# Patient Record
Sex: Female | Born: 1975 | Race: Black or African American | Hispanic: No | Marital: Single
Health system: Southern US, Community
[De-identification: ages and names within clinical notes are randomized; demographics above are authoritative.]

---

## 2010-01-12 ENCOUNTER — Encounter: Payer: Self-pay | Admitting: Internal Medicine

## 2010-01-29 ENCOUNTER — Encounter: Payer: Self-pay | Admitting: Internal Medicine

## 2012-04-14 ENCOUNTER — Emergency Department: Payer: Self-pay | Admitting: Emergency Medicine

## 2017-01-08 ENCOUNTER — Encounter: Payer: Self-pay | Admitting: Emergency Medicine

## 2017-01-08 ENCOUNTER — Emergency Department
Admission: EM | Admit: 2017-01-08 | Discharge: 2017-01-08 | Disposition: A | Discharge status: Home / Self Care | Payer: 59 | Attending: Emergency Medicine | Admitting: Emergency Medicine

## 2017-01-08 ENCOUNTER — Emergency Department: Payer: 59

## 2017-01-08 DIAGNOSIS — I10 Essential (primary) hypertension: Secondary | ICD-10-CM | POA: Insufficient documentation

## 2017-01-08 DIAGNOSIS — R079 Chest pain, unspecified: Secondary | ICD-10-CM | POA: Diagnosis not present

## 2017-01-08 DIAGNOSIS — F12122 Cannabis abuse with intoxication with perceptual disturbance: Secondary | ICD-10-CM | POA: Insufficient documentation

## 2017-01-08 DIAGNOSIS — F12922 Cannabis use, unspecified with intoxication with perceptual disturbance: Secondary | ICD-10-CM

## 2017-01-08 DIAGNOSIS — R000 Tachycardia, unspecified: Secondary | ICD-10-CM | POA: Diagnosis not present

## 2017-01-08 LAB — URINE DRUG SCREEN, QUALITATIVE (ARMC ONLY)
AMPHETAMINES, UR SCREEN: NOT DETECTED
BENZODIAZEPINE, UR SCRN: NOT DETECTED
Barbiturates, Ur Screen: NOT DETECTED
CANNABINOID 50 NG, UR ~~LOC~~: POSITIVE — AB
Cocaine Metabolite,Ur ~~LOC~~: NOT DETECTED
MDMA (Ecstasy)Ur Screen: NOT DETECTED
Methadone Scn, Ur: NOT DETECTED
Opiate, Ur Screen: NOT DETECTED
PHENCYCLIDINE (PCP) UR S: NOT DETECTED
TRICYCLIC, UR SCREEN: NOT DETECTED

## 2017-01-08 LAB — BASIC METABOLIC PANEL
ANION GAP: 7 (ref 5–15)
BUN: 10 mg/dL (ref 6–20)
CHLORIDE: 108 mmol/L (ref 101–111)
CO2: 24 mmol/L (ref 22–32)
Calcium: 9.3 mg/dL (ref 8.9–10.3)
Creatinine, Ser: 0.74 mg/dL (ref 0.44–1.00)
Glucose, Bld: 151 mg/dL — ABNORMAL HIGH (ref 65–99)
POTASSIUM: 3.3 mmol/L — AB (ref 3.5–5.1)
SODIUM: 139 mmol/L (ref 135–145)

## 2017-01-08 LAB — CBC
HEMATOCRIT: 37.4 % (ref 35.0–47.0)
Hemoglobin: 12.6 g/dL (ref 12.0–16.0)
MCH: 28.1 pg (ref 26.0–34.0)
MCHC: 33.8 g/dL (ref 32.0–36.0)
MCV: 83.2 fL (ref 80.0–100.0)
Platelets: 280 10*3/uL (ref 150–440)
RBC: 4.49 MIL/uL (ref 3.80–5.20)
RDW: 13.6 % (ref 11.5–14.5)
WBC: 8.3 10*3/uL (ref 3.6–11.0)

## 2017-01-08 LAB — TROPONIN I

## 2017-01-08 MED ORDER — SODIUM CHLORIDE 0.9 % IV BOLUS (SEPSIS)
1000.0000 mL | Freq: Once | INTRAVENOUS | Status: AC
  Administered 2017-01-08: 1000 mL via INTRAVENOUS

## 2017-01-08 MED ORDER — LORAZEPAM 2 MG/ML IJ SOLN
1.0000 mg | Freq: Once | INTRAMUSCULAR | Status: AC
  Administered 2017-01-08: 1 mg via INTRAVENOUS
  Filled 2017-01-08: qty 1

## 2017-01-08 NOTE — ED Provider Notes (Signed)
[facility] Emergency Department Provider Note   First MD Initiated Contact with Patient 01/08/17 0448     (approximate)  I have reviewed the triage vital signs and the nursing notes.   HISTORY  Chief Complaint Tachycardia    HPI Melody Hill is a 41 y.o. female presents to the emergency department with acute onset of chest tightness and tingling sensation over the entire body rapid heartbeat and throat discomfort after having alcoholic beverages and eating a "brownie". Patient states that she believes it may have been something in the brownie".   Past medical history Hypertension There are no active problems to display for this patient.   Past surgical history None  Prior to Admission medications   Medication Sig Start Date End Date Taking? Authorizing Provider  verapamil (CALAN-SR) 240 MG CR tablet Take 1 tablet by mouth daily.   Yes [provider]    Allergies No known drug allergies No family history on file.  Social History Social History  Substance Use Topics  . Smoking status: Not on file  . Smokeless tobacco: Not on file  . Alcohol use Not on file    Review of Systems Constitutional: No fever/chills Eyes: No visual changes. ENT: No sore throat. Cardiovascular: Denies chest pain. Respiratory: Denies shortness of breath. Gastrointestinal: No abdominal pain.  No nausea, no vomiting.  No diarrhea.  No constipation. Genitourinary: Negative for dysuria. Musculoskeletal: Negative for neck pain.  Negative for back pain. Integumentary: Negative for rash. Neurological: Negative for headaches, focal weakness or numbness. Psychiatric:Positive for anxiety, positive EtOH condition   ____________________________________________   PHYSICAL EXAM:  VITAL SIGNS: ED Triage Vitals  Enc Vitals Group     BP 01/08/17 0429 (!) 160/108     Pulse Rate 01/08/17 0429 (!) 128     Resp 01/08/17 0429 16     Temp 01/08/17 0429  98.5 F (36.9 C)     Temp src --      SpO2 01/08/17 0429 100 %     Weight 01/08/17 0430 65.3 kg (144 lb)     Height 01/08/17 0430 1.626 m (5\' 4" )     Head Circumference --      Peak Flow --      Pain Score 01/08/17 0429 4     Pain Loc --      Pain Edu? --      Excl. in GC? --     Constitutional: Alert and oriented. Well appearing and in no acute distress.Appears anxious Eyes: Conjunctivae are normal. Head: Atraumatic. Mouth/Throat: Mucous membranes are moist.  Oropharynx non-erythematous. Neck: No stridor.   Cardiovascular: Tachycardia, regular rhythm. Good peripheral circulation. Grossly normal heart sounds. Respiratory: Normal respiratory effort.  No retractions. Lungs CTAB. Gastrointestinal: Soft and nontender. No distention.  Musculoskeletal: No lower extremity tenderness nor edema. No gross deformities of extremities. Neurologic:  Normal speech and language. No gross focal neurologic deficits are appreciated.  Skin:  Skin is warm, dry and intact. No rash noted. Psychiatric: Anxious affect. Speech and behavior are normal.  ____________________________________________   LABS (all labs ordered are listed, but only abnormal results are displayed)  Labs Reviewed  BASIC METABOLIC PANEL - Abnormal; Notable for the following:       Result Value   Potassium 3.3 (*)    Glucose, Bld 151 (*)    All other components within normal limits  URINE DRUG SCREEN, QUALITATIVE ([facility] ONLY) - Abnormal; Notable for the following:    Cannabinoid 50 Ng,  Ur [place] POSITIVE (*)    All other components within normal limits  CBC  TROPONIN I   ____________________________________________  EKG  ED ECG REPORT I, [place] N Lavere Shinsky, the attending physician, personally viewed and interpreted this ECG.   Date: 01/08/2017  EKG Time: 4:28 AM  Rate: 132  Rhythm: Sinus tachycardia  Axis: Normal  Intervals: Normal  ST&T Change: None  ____________________________________________  RADIOLOGY I,  [place] Marlin Brys, personally viewed and evaluated these images (plain radiographs) as part of my medical decision making, as well as reviewing the written report by the radiologist.  Dg Chest Port 1 View  Result Date: 01/08/2017 CLINICAL DATA:  Acute onset of body tingling and tachycardia. Throat discomfort. Initial encounter. EXAM: PORTABLE CHEST 1 VIEW COMPARISON:  None. FINDINGS: The lungs are well-aerated and clear. There is no evidence of focal opacification, pleural effusion or pneumothorax. The cardiomediastinal silhouette is within normal limits. No acute osseous abnormalities are seen. IMPRESSION: No acute cardiopulmonary process seen. Electronically Signed   By: Roanna RaiderJeffery  Chang M.D.   On: 01/08/2017 05:14      Procedures   ____________________________________________   INITIAL IMPRESSION / ASSESSMENT AND PLAN / ED COURSE  Pertinent labs & imaging results that were available during my care of the patient were reviewed by me and considered in my medical decision making (see chart for details).  41 year old female presenting to the emergency department with above stated symptoms. Suspect etiology patient's symptoms secondary to marijuana. Patient received 1 mg IV Ativan 2 L IV normal saline with resolution of symptoms.      ____________________________________________  FINAL CLINICAL IMPRESSION(S) / ED DIAGNOSES  Final diagnoses:  Sinus tachycardia  Cannabis intoxication with perceptual disturbance (HCC)     MEDICATIONS GIVEN DURING THIS VISIT:  Medications  LORazepam (ATIVAN) injection 1 mg (1 mg Intravenous Given 01/08/17 0456)  sodium chloride 0.9 % bolus 1,000 mL (0 mLs Intravenous Stopped 01/08/17 0612)  sodium chloride 0.9 % bolus 1,000 mL (1,000 mLs Intravenous New Bag/Given 01/08/17 [id number])     NEW OUTPATIENT MEDICATIONS STARTED DURING THIS VISIT:  New Prescriptions   No medications on file    Modified Medications   No medications on file    Discontinued  Medications   No medications on file     Note:  This document was prepared using Dragon voice recognition software and may include unintentional dictation errors.    Darci CurrentBrown, [place] N, MD 01/08/17 [phone]

## 2017-01-08 NOTE — ED Notes (Signed)
Pt verbalized understanding of discharge instructions. NAD at this time. 

## 2017-01-08 NOTE — ED Triage Notes (Signed)
Pt states "i went out for girl's night and had a couple of drinks and some brownies. I think there were drugs in the brownies because not I feel weird and tingly." pt complains of body tingling, tachycardia and "discomfort in my throat". Pt ambulatory without difficulty, resps unlabored.

## 2017-01-08 NOTE — ED Notes (Signed)
Dr. Manson PasseyBrown in treatment room. Pt placed on cont ekg monitoring.

## 2018-10-23 IMAGING — DX DG CHEST 1V PORT
1 series · 1 of 1 positions shown · non-contrast
Comparison: None.

CLINICAL DATA: Acute onset of body tingling and tachycardia. Throat
discomfort. Initial encounter.

EXAM:
PORTABLE CHEST 1 VIEW

[chest ap]
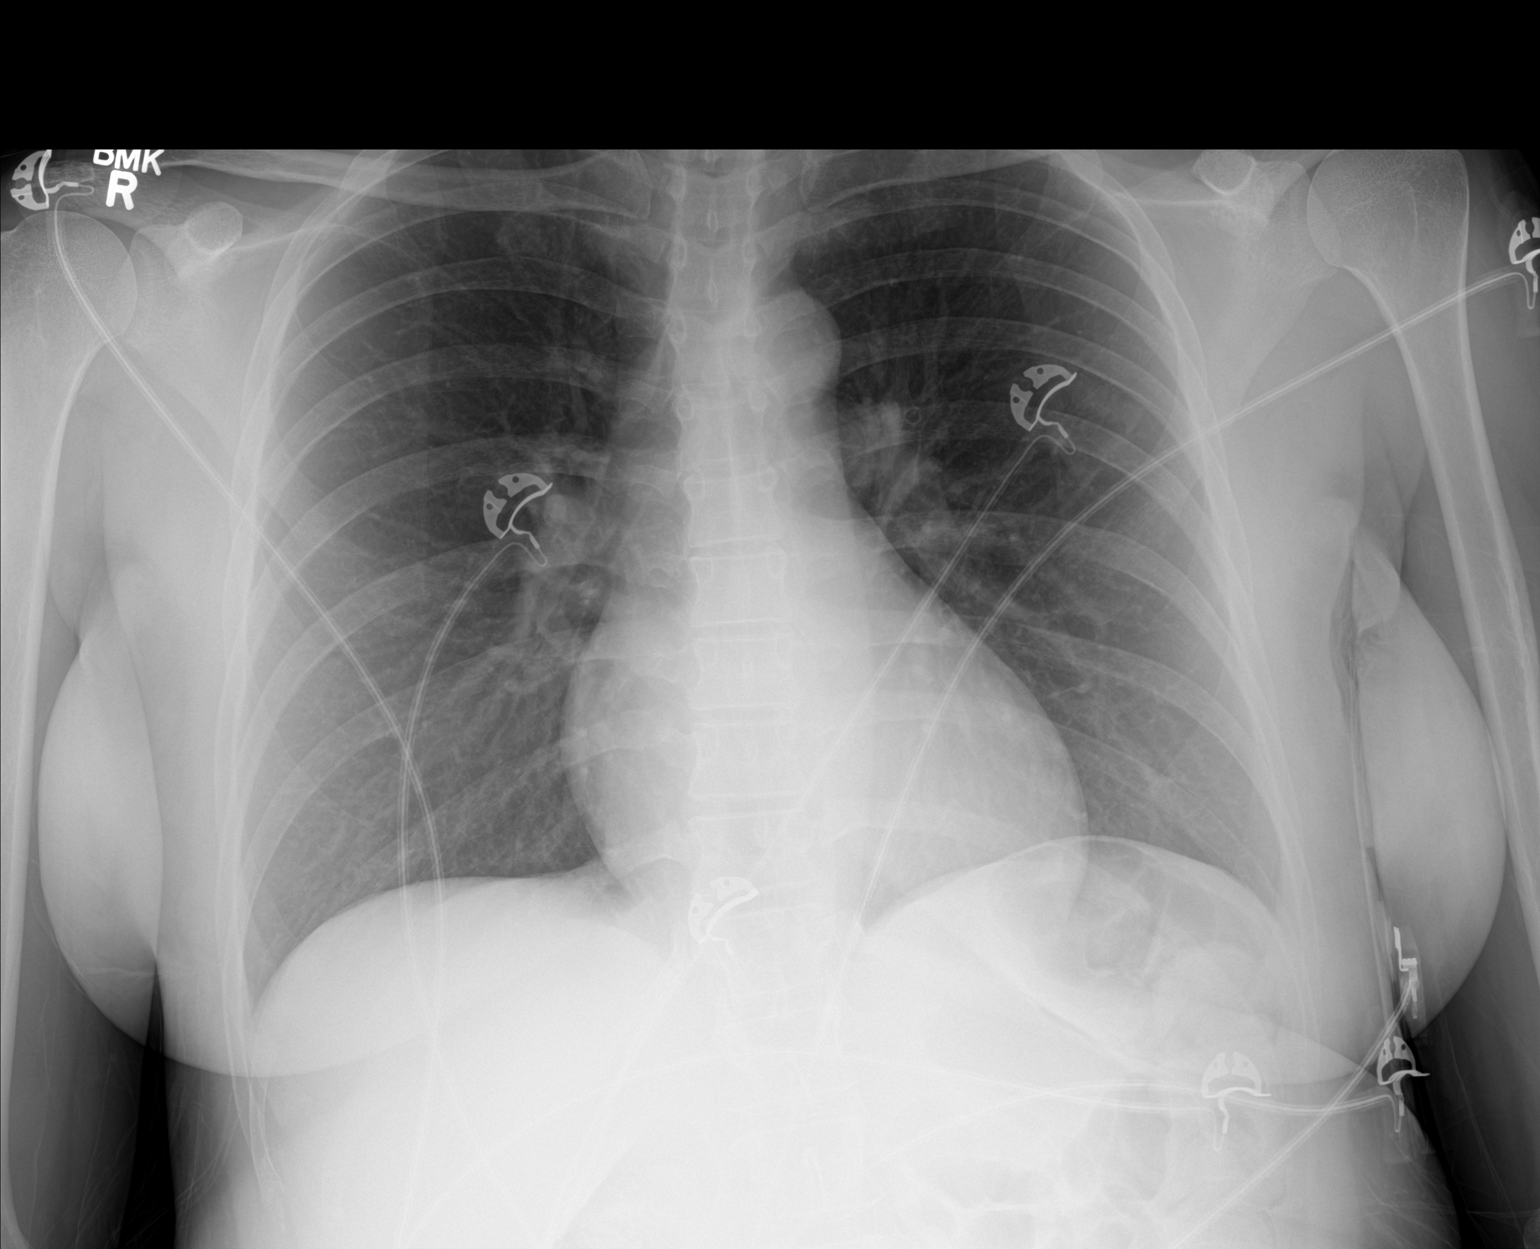

[1 of 1 positions shown; findings below may reference images not displayed]

FINDINGS: The lungs are well-aerated and clear. There is no evidence of focal
opacification, pleural effusion or pneumothorax.

The cardiomediastinal silhouette is within normal limits. No acute
osseous abnormalities are seen.
IMPRESSION: No acute cardiopulmonary process seen.
